# Patient Record
Sex: Female | Born: 1955 | Race: White | Marital: Single | Smoking: Never smoker
Health system: Southern US, Community
[De-identification: ages and names within clinical notes are randomized; demographics above are authoritative.]

## PROBLEM LIST (undated history)

## (undated) ENCOUNTER — Ambulatory Visit: Admission: EM | Disposition: A | Discharge status: Other Acute Inpatient Hospital | Payer: Medicare HMO

## (undated) DIAGNOSIS — M5030 Other cervical disc degeneration, unspecified cervical region: Secondary | ICD-10-CM

## (undated) DIAGNOSIS — E119 Type 2 diabetes mellitus without complications: Secondary | ICD-10-CM

## (undated) DIAGNOSIS — E669 Obesity, unspecified: Secondary | ICD-10-CM

## (undated) DIAGNOSIS — J45909 Unspecified asthma, uncomplicated: Secondary | ICD-10-CM

## (undated) DIAGNOSIS — M1990 Unspecified osteoarthritis, unspecified site: Secondary | ICD-10-CM

## (undated) DIAGNOSIS — R002 Palpitations: Secondary | ICD-10-CM

## (undated) DIAGNOSIS — G2581 Restless legs syndrome: Secondary | ICD-10-CM

## (undated) DIAGNOSIS — G629 Polyneuropathy, unspecified: Secondary | ICD-10-CM

## (undated) DIAGNOSIS — M069 Rheumatoid arthritis, unspecified: Secondary | ICD-10-CM

## (undated) DIAGNOSIS — G4730 Sleep apnea, unspecified: Secondary | ICD-10-CM

## (undated) DIAGNOSIS — Z8489 Family history of other specified conditions: Secondary | ICD-10-CM

## (undated) DIAGNOSIS — H539 Unspecified visual disturbance: Secondary | ICD-10-CM

## (undated) DIAGNOSIS — T8859XA Other complications of anesthesia, initial encounter: Secondary | ICD-10-CM

## (undated) DIAGNOSIS — D649 Anemia, unspecified: Secondary | ICD-10-CM

## (undated) DIAGNOSIS — F32A Depression, unspecified: Secondary | ICD-10-CM

## (undated) DIAGNOSIS — I2510 Atherosclerotic heart disease of native coronary artery without angina pectoris: Secondary | ICD-10-CM

## (undated) DIAGNOSIS — K219 Gastro-esophageal reflux disease without esophagitis: Secondary | ICD-10-CM

## (undated) DIAGNOSIS — M779 Enthesopathy, unspecified: Secondary | ICD-10-CM

## (undated) DIAGNOSIS — Z8669 Personal history of other diseases of the nervous system and sense organs: Secondary | ICD-10-CM

## (undated) HISTORY — PX: COLONOSCOPY: SHX174

## (undated) HISTORY — PX: CHOLECYSTECTOMY: SHX55

## (undated) HISTORY — PX: EYE SURGERY: SHX253

## (undated) HISTORY — PX: OTHER SURGICAL HISTORY: SHX169

## (undated) HISTORY — PX: HEMICOLECTOMY: SHX854

---
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
## 1994-02-06 HISTORY — PX: ABDOMINAL HYSTERECTOMY: SHX81

## 1998-03-09 HISTORY — PX: APPENDECTOMY: SHX54

## 2015-02-07 DIAGNOSIS — M23209 Derangement of unspecified meniscus due to old tear or injury, unspecified knee: Secondary | ICD-10-CM

## 2015-02-07 HISTORY — DX: Derangement of unspecified meniscus due to old tear or injury, unspecified knee: M23.209

## 2016-02-07 HISTORY — PX: OTHER SURGICAL HISTORY: SHX169

## 2017-02-06 DIAGNOSIS — H3530 Unspecified macular degeneration: Secondary | ICD-10-CM

## 2017-02-06 HISTORY — PX: CYSTOURETHROSCOPY: SHX476

## 2017-02-06 HISTORY — DX: Unspecified macular degeneration: H35.30

## 2017-06-05 HISTORY — PX: OTHER SURGICAL HISTORY: SHX169

## 2020-07-08 ENCOUNTER — Encounter: Payer: Self-pay | Admitting: *Deleted

## 2020-07-09 ENCOUNTER — Encounter
Admission: RE | Disposition: A | Discharge status: Home / Self Care | Payer: Self-pay | Source: Home / Self Care | Attending: Gastroenterology

## 2020-07-09 ENCOUNTER — Ambulatory Visit
Admission: RE | Admit: 2020-07-09 | Discharge: 2020-07-09 | Disposition: A | Discharge status: Home / Self Care | Payer: Medicare HMO | Attending: Gastroenterology | Admitting: Gastroenterology

## 2020-07-09 ENCOUNTER — Encounter: Payer: Self-pay | Admitting: *Deleted

## 2020-07-09 ENCOUNTER — Ambulatory Visit: Payer: Medicare HMO | Admitting: Certified Registered Nurse Anesthetist

## 2020-07-09 ENCOUNTER — Other Ambulatory Visit: Payer: Self-pay

## 2020-07-09 DIAGNOSIS — Z7984 Long term (current) use of oral hypoglycemic drugs: Secondary | ICD-10-CM | POA: Insufficient documentation

## 2020-07-09 DIAGNOSIS — Z880 Allergy status to penicillin: Secondary | ICD-10-CM | POA: Diagnosis not present

## 2020-07-09 DIAGNOSIS — Z7982 Long term (current) use of aspirin: Secondary | ICD-10-CM | POA: Insufficient documentation

## 2020-07-09 DIAGNOSIS — K2289 Other specified disease of esophagus: Secondary | ICD-10-CM | POA: Insufficient documentation

## 2020-07-09 DIAGNOSIS — Z794 Long term (current) use of insulin: Secondary | ICD-10-CM | POA: Insufficient documentation

## 2020-07-09 DIAGNOSIS — K2100 Gastro-esophageal reflux disease with esophagitis, without bleeding: Secondary | ICD-10-CM | POA: Diagnosis not present

## 2020-07-09 DIAGNOSIS — Z79899 Other long term (current) drug therapy: Secondary | ICD-10-CM | POA: Insufficient documentation

## 2020-07-09 DIAGNOSIS — Z881 Allergy status to other antibiotic agents status: Secondary | ICD-10-CM | POA: Insufficient documentation

## 2020-07-09 DIAGNOSIS — R1310 Dysphagia, unspecified: Secondary | ICD-10-CM | POA: Insufficient documentation

## 2020-07-09 DIAGNOSIS — Z888 Allergy status to other drugs, medicaments and biological substances status: Secondary | ICD-10-CM | POA: Diagnosis not present

## 2020-07-09 DIAGNOSIS — Z885 Allergy status to narcotic agent status: Secondary | ICD-10-CM | POA: Diagnosis not present

## 2020-07-09 DIAGNOSIS — Z5309 Procedure and treatment not carried out because of other contraindication: Secondary | ICD-10-CM | POA: Diagnosis not present

## 2020-07-09 DIAGNOSIS — M069 Rheumatoid arthritis, unspecified: Secondary | ICD-10-CM | POA: Insufficient documentation

## 2020-07-09 DIAGNOSIS — E119 Type 2 diabetes mellitus without complications: Secondary | ICD-10-CM | POA: Insufficient documentation

## 2020-07-09 HISTORY — DX: Enthesopathy, unspecified: M77.9

## 2020-07-09 HISTORY — DX: Sleep apnea, unspecified: G47.30

## 2020-07-09 HISTORY — DX: Rheumatoid arthritis, unspecified: M06.9

## 2020-07-09 HISTORY — DX: Restless legs syndrome: G25.81

## 2020-07-09 HISTORY — DX: Personal history of other diseases of the nervous system and sense organs: Z86.69

## 2020-07-09 HISTORY — DX: Other complications of anesthesia, initial encounter: T88.59XA

## 2020-07-09 HISTORY — DX: Atherosclerotic heart disease of native coronary artery without angina pectoris: I25.10

## 2020-07-09 HISTORY — DX: Type 2 diabetes mellitus without complications: E11.9

## 2020-07-09 HISTORY — DX: Other cervical disc degeneration, unspecified cervical region: M50.30

## 2020-07-09 HISTORY — DX: Unspecified osteoarthritis, unspecified site: M19.90

## 2020-07-09 HISTORY — DX: Depression, unspecified: F32.A

## 2020-07-09 HISTORY — DX: Unspecified asthma, uncomplicated: J45.909

## 2020-07-09 HISTORY — PX: ESOPHAGOGASTRODUODENOSCOPY (EGD) WITH PROPOFOL: SHX5813

## 2020-07-09 HISTORY — DX: Palpitations: R00.2

## 2020-07-09 HISTORY — DX: Gastro-esophageal reflux disease without esophagitis: K21.9

## 2020-07-09 HISTORY — DX: Obesity, unspecified: E66.9

## 2020-07-09 HISTORY — DX: Polyneuropathy, unspecified: G62.9

## 2020-07-09 HISTORY — DX: Anemia, unspecified: D64.9

## 2020-07-09 HISTORY — DX: Unspecified visual disturbance: H53.9

## 2020-07-09 HISTORY — DX: Family history of other specified conditions: Z84.89

## 2020-07-09 LAB — GLUCOSE, CAPILLARY: Glucose-Capillary: 137 mg/dL — ABNORMAL HIGH (ref 70–99)

## 2020-07-09 SURGERY — ESOPHAGOGASTRODUODENOSCOPY (EGD) WITH PROPOFOL
Anesthesia: General

## 2020-07-09 MED ORDER — PROPOFOL 500 MG/50ML IV EMUL
INTRAVENOUS | Status: DC | PRN
  Administered 2020-07-09: 200 ug/kg/min via INTRAVENOUS

## 2020-07-09 MED ORDER — LIDOCAINE HCL (CARDIAC) PF 100 MG/5ML IV SOSY
PREFILLED_SYRINGE | INTRAVENOUS | Status: DC | PRN
  Administered 2020-07-09: 60 mg via INTRAVENOUS

## 2020-07-09 MED ORDER — GLYCOPYRROLATE 0.2 MG/ML IJ SOLN
INTRAMUSCULAR | Status: DC | PRN
  Administered 2020-07-09: .1 mg via INTRAVENOUS

## 2020-07-09 MED ORDER — PROPOFOL 10 MG/ML IV BOLUS: INTRAVENOUS | Status: DC | PRN

## 2020-07-09 MED ORDER — PROPOFOL 10 MG/ML IV BOLUS
INTRAVENOUS | Status: DC | PRN
  Administered 2020-07-09: 80 mg via INTRAVENOUS

## 2020-07-09 MED ORDER — SODIUM CHLORIDE 0.9 % IV SOLN: INTRAVENOUS | Status: DC

## 2020-07-09 NOTE — H&P (Signed)
Outpatient short stay form Pre-procedure 07/09/2020 11:13 AM Kathleen Lot MD, MPH  Primary Physician: None listed  Reason for visit:  Dysphagia  History of present illness:   65 y/o lady with history of nissen fundoplication and DM II here for EGD to evaluated chronic solid food dysphagia. Only certain foods give her trouble. No blood thinners. No family history of GI malignancies. No significant new symptoms.    Current Facility-Administered Medications:  .  0.9 %  sodium chloride infusion, , Intravenous, Continuous, Manali Mcelmurry, Rossie Muskrat, MD, Last Rate: 20 mL/hr at 07/09/20 1048, New Bag at 07/09/20 1048  Medications Prior to Admission  Medication Sig Dispense Refill Last Dose  . ALPRAZolam (XANAX) 0.5 MG tablet Take 0.5 mg by mouth 2 (two) times daily as needed for anxiety.   Past Week at Unknown time  . aspirin 81 MG EC tablet Take 81 mg by mouth daily. Swallow whole.   07/07/2020 at 2000  . atorvastatin (LIPITOR) 40 MG tablet Take 40 mg by mouth daily.   07/08/2020 at 2000  . buPROPion (WELLBUTRIN) 100 MG tablet Take 100 mg by mouth daily.   07/06/2020  . DULoxetine (CYMBALTA) 60 MG capsule Take 60 mg by mouth at bedtime.   07/08/2020 at 2100  . gabapentin (NEURONTIN) 400 MG capsule Take 400 mg by mouth 3 (three) times daily.   07/08/2020 at 2100  . insulin glargine, 2 Unit Dial, (TOUJEO MAX SOLOSTAR) 300 UNIT/ML Solostar Pen Inject 18 Units into the skin at bedtime.   07/08/2020 at 2100  . insulin lispro (HUMALOG) 100 UNIT/ML KwikPen Junior Inject 6 Units into the skin.   07/08/2020 at Unknown time  . metFORMIN (GLUCOPHAGE) 500 MG tablet Take by mouth 2 (two) times daily with a meal.   07/08/2020 at 1800  . metoprolol succinate (TOPROL-XL) 25 MG 24 hr tablet Take 25 mg by mouth daily.   07/08/2020 at 2100  . nitroGLYCERIN (NITROSTAT) 0.4 MG SL tablet Place 0.4 mg under the tongue as needed for chest pain.   Past Month at Unknown time  . pantoprazole (PROTONIX) 40 MG tablet Take 40 mg by mouth daily.    07/07/2020  . rOPINIRole (REQUIP) 0.25 MG tablet Take 0.25 mg by mouth daily.   07/08/2020 at 2100  . SEMAGLUTIDE,0.25 OR 0.5MG /DOS, [place] Inject into the skin every 7 (seven) days.   07/05/2020  . DOCUSATE SODIUM PO Take by mouth daily. (Patient not taking: Reported on 07/09/2020)   Not Taking at Unknown time  . estradiol (ESTRACE) 0.1 MG/GM vaginal cream Place 1 Applicatorful vaginally daily. (Patient not taking: Reported on 07/09/2020)   Not Taking at Unknown time  . isosorbide dinitrate (ISORDIL) 30 MG tablet Take 30 mg by mouth daily. (Patient not taking: Reported on 07/09/2020)   Not Taking at Unknown time     Allergies  Allergen Reactions  . Fentanyl Anaphylaxis  . Versed [Midazolam] Anaphylaxis  . Benadryl [Diphenhydramine]   . Ceclor [Cefaclor]   . Erythromycin   . Erythromycin Base   . Fenoprofen Calcium   . Nalfon [Fenoprofen]   . Penicillins   . Phenergan [Promethazine]   . Succinylcholine      Past Medical History:  Diagnosis Date  . Anemia   . Asthma   . Bone spur    left heel  . Chronic meniscal tear of knee 2017   let knee, inoperable  . Complication of anesthesia   . Coronary artery disease   . DDD (degenerative disc disease), cervical   .  Depression   . Diabetes mellitus without complication (HCC)   . Family history of adverse reaction to anesthesia   . GERD (gastroesophageal reflux disease)   . History of cataract   . Macular degeneration 2019  . Obesity   . Osteoarthritis   . Palpitations   . Peripheral neuropathy   . Restless leg syndrome   . Rheumatoid arthritis (HCC)   . Sleep apnea   . Vision abnormalities     Review of systems:  Otherwise negative.    Physical Exam  Gen: Alert, oriented. Appears stated age.  HEENT: PERRLA. Lungs: No respiratory distress CV: RRR Abd: soft, benign, no masses Ext: No edema    Planned procedures: Proceed with EGD. The patient understands the nature of the planned procedure, indications, risks, alternatives  and potential complications including but not limited to bleeding, infection, perforation, damage to internal organs and possible oversedation/side effects from anesthesia. The patient agrees and gives consent to proceed.  Please refer to procedure notes for findings, recommendations and patient disposition/instructions.     Kathleen Lot MD, MPH Gastroenterology 07/09/2020  11:13 AM

## 2020-07-09 NOTE — Op Note (Signed)
[facility] Gastroenterology Patient Name: Kathleen Griffin Procedure Date: 07/09/2020 11:21 AM MRN: [id number] Account #: [id number] Date of Birth: [birth year] Admit Type: Outpatient Age: 65 Room: [facility] ENDO ROOM 3 Gender: Female Note Status: Finalized Procedure:             Upper GI endoscopy Indications:           Dysphagia Providers:             Andrey Farmer MD, MD Referring MD:          Marva Panda (Referring MD) Medicines:             Monitored Anesthesia Care Complications:         No immediate complications. Estimated blood loss:                         Minimal. Procedure:             Pre-Anesthesia Assessment:                        - Prior to the procedure, a History and Physical was                         performed, and patient medications and allergies were                         reviewed. The patient is competent. The risks and                         benefits of the procedure and the sedation options and                         risks were discussed with the patient. All questions                         were answered and informed consent was obtained.                         Patient identification and proposed procedure were                         verified by the physician, the nurse, the anesthetist                         and the technician in the endoscopy suite. Mental                         Status Examination: alert and oriented. Airway                         Examination: normal oropharyngeal airway and neck                         mobility. Respiratory Examination: clear to                         auscultation. CV Examination: normal. Prophylactic                         Antibiotics: The  patient does not require prophylactic                         antibiotics. Prior Anticoagulants: The patient has                         taken no previous anticoagulant or antiplatelet                         agents. ASA Grade Assessment: III - A  patient with                         severe systemic disease. After reviewing the risks and                         benefits, the patient was deemed in satisfactory                         condition to undergo the procedure. The anesthesia                         plan was to use monitored anesthesia care (MAC).                         Immediately prior to administration of medications,                         the patient was re-assessed for adequacy to receive                         sedatives. The heart rate, respiratory rate, oxygen                         saturations, blood pressure, adequacy of pulmonary                         ventilation, and response to care were monitored                         throughout the procedure. The physical status of the                         patient was re-assessed after the procedure.                        After obtaining informed consent, the endoscope was                         passed under direct vision. Throughout the procedure,                         the patient's blood pressure, pulse, and oxygen                         saturations were monitored continuously. The Endoscope                         was introduced through the mouth, with the intention  of advancing to the duodenum. The scope was advanced                         to the gastric fundus before the procedure was                         aborted. Medications were given. The procedure was                         aborted due to presence of food. Findings:      A single 9 mm nodule with a localized distribution was found at the       gastroesophageal junction. Biopsies were taken with a cold forceps for       histology. Estimated blood loss was minimal.      A medium amount of food (residue) was found in the gastric body. Impression:            - The procedure was aborted due to presence of food.                        - Nodule found in the esophagus. Biopsied.                         - A medium amount of food (residue) in the stomach. Recommendation:        - Discharge patient to home.                        - Resume previous diet.                        - Continue present medications.                        - Await pathology results.                        - Repeat upper endoscopy at appointment to be                         scheduled after full day of clears.                        - Return to referring physician as previously                         scheduled. Procedure Code(s):     --- Professional ---                        [phone], 64, Esophagogastroduodenoscopy, flexible,                         transoral; with biopsy, single or multiple Diagnosis Code(s):     --- Professional ---                        K22.8, Other specified diseases of esophagus                        R13.10, Dysphagia, unspecified CPT copyright 2019 [employer] Association. All rights  reserved. The codes documented in this report are preliminary and upon coder review may  be revised to meet current compliance requirements. Andrey Farmer MD, MD 07/09/2020 11:37:49 AM Number of Addenda: 0 Note Initiated On: 07/09/2020 11:21 AM Estimated Blood Loss:  Estimated blood loss was minimal.      [facility]

## 2020-07-09 NOTE — Interval H&P Note (Signed)
History and Physical Interval Note:  07/09/2020 11:16 AM  Kathleen Griffin  has presented today for surgery, with the diagnosis of DYSPHAGIA.  The various methods of treatment have been discussed with the patient and family. After consideration of risks, benefits and other options for treatment, the patient has consented to  Procedure(s): ESOPHAGOGASTRODUODENOSCOPY (EGD) WITH PROPOFOL (N/A) as a surgical intervention.  The patient's history has been reviewed, patient examined, no change in status, stable for surgery.  I have reviewed the patient's chart and labs.  Questions were answered to the patient's satisfaction.     Regis Bill  Ok to proceed with EGD

## 2020-07-09 NOTE — Anesthesia Procedure Notes (Addendum)
Date/Time: 07/09/2020 11:22 AM Performed by: Milagros Reap, CRNA Oxygen Delivery Method: Simple face mask

## 2020-07-09 NOTE — Transfer of Care (Signed)
Immediate Anesthesia Transfer of Care Note  Patient: Kathleen Griffin  Procedure(s) Performed: ESOPHAGOGASTRODUODENOSCOPY (EGD) WITH PROPOFOL (N/A )  Patient Location: PACU  Anesthesia Type:General  Level of Consciousness: sedated  Airway & Oxygen Therapy: Patient Spontanous Breathing and Patient connected to face mask oxygen  Post-op Assessment: Report given to RN and Post -op Vital signs reviewed and stable  Post vital signs: Reviewed and stable  Last Vitals:  Vitals Value Taken Time  BP 119/76 07/09/20 1143  Temp 36.3 C 07/09/20 1143  Pulse 72 07/09/20 1143  Resp 12 07/09/20 1143  SpO2 100 % 07/09/20 1143  Vitals shown include unvalidated device data.  Last Pain:  Vitals:   07/09/20 1143  TempSrc: Temporal  PainSc: Asleep         Complications: No complications documented.

## 2020-07-11 NOTE — Anesthesia Preprocedure Evaluation (Signed)
Anesthesia Evaluation  Patient identified by MRN, date of birth, ID band Patient awake    Reviewed: Allergy & Precautions, H&P , NPO status , Patient's Chart, lab work & pertinent test results, reviewed documented beta blocker date and time   History of Anesthesia Complications (+) Family history of anesthesia reaction and history of anesthetic complications  Airway Mallampati: II   Neck ROM: full    Dental  (+) Poor Dentition   Pulmonary asthma , sleep apnea ,    Pulmonary exam normal        Cardiovascular Exercise Tolerance: Poor + CAD  Normal cardiovascular exam Rhythm:regular Rate:Normal     Neuro/Psych PSYCHIATRIC DISORDERS Depression  Neuromuscular disease    GI/Hepatic Neg liver ROS, GERD  Medicated,  Endo/Other  negative endocrine ROSdiabetes, Poorly Controlled  Renal/GU negative Renal ROS  negative genitourinary   Musculoskeletal   Abdominal   Peds  Hematology  (+) Blood dyscrasia, anemia ,   Anesthesia Other Findings Past Medical History: No date: Anemia No date: Asthma No date: Bone spur     Comment:  left heel 2017: Chronic meniscal tear of knee     Comment:  let knee, inoperable No date: Complication of anesthesia No date: Coronary artery disease No date: DDD (degenerative disc disease), cervical No date: Depression No date: Diabetes mellitus without complication (HCC) No date: Family history of adverse reaction to anesthesia No date: GERD (gastroesophageal reflux disease) No date: History of cataract 2019: Macular degeneration No date: Obesity No date: Osteoarthritis No date: Palpitations No date: Peripheral neuropathy No date: Restless leg syndrome No date: Rheumatoid arthritis (HCC) No date: Sleep apnea No date: Vision abnormalities Past Surgical History: 1996: ABDOMINAL HYSTERECTOMY 03/1998: APPENDECTOMY No date: CHOLECYSTECTOMY No date: COLONOSCOPY 02/06/2017:  CYSTOURETHROSCOPY     Comment:  stent No date: EYE SURGERY     Comment:  eyelid 06/05/2017: nephrectomy partial robot assisted 1965: TONSILLECTOMY AND ADENOIDECTOMY 2018: vitreous retinal surgery BMI    Body Mass Index: 29.29 kg/m     Reproductive/Obstetrics negative OB ROS                             Anesthesia Physical Anesthesia Plan  ASA: III  Anesthesia Plan: General   Post-op Pain Management:    Induction:   PONV Risk Score and Plan:   Airway Management Planned:   Additional Equipment:   Intra-op Plan:   Post-operative Plan:   Informed Consent: I have reviewed the patients History and Physical, chart, labs and discussed the procedure including the risks, benefits and alternatives for the proposed anesthesia with the patient or authorized representative who has indicated his/her understanding and acceptance.     Dental Advisory Given  Plan Discussed with: CRNA  Anesthesia Plan Comments:         Anesthesia Quick Evaluation

## 2020-07-11 NOTE — Anesthesia Postprocedure Evaluation (Signed)
Anesthesia Post Note  Patient: Kathleen Griffin  Procedure(s) Performed: ESOPHAGOGASTRODUODENOSCOPY (EGD) WITH PROPOFOL (N/A )  Patient location during evaluation: PACU Anesthesia Type: General Level of consciousness: awake and alert Pain management: pain level controlled Vital Signs Assessment: post-procedure vital signs reviewed and stable Respiratory status: spontaneous breathing, nonlabored ventilation, respiratory function stable and patient connected to nasal cannula oxygen Cardiovascular status: blood pressure returned to baseline and stable Postop Assessment: no apparent nausea or vomiting Anesthetic complications: no   No complications documented.   Last Vitals:  Vitals:   07/09/20 1153 07/09/20 1203  BP: 113/69 116/77  Pulse: 78 75  Resp: 13 16  Temp:    SpO2: 99% 100%    Last Pain:  Vitals:   07/09/20 1203  TempSrc:   PainSc: 0-No pain                 Yevette Edwards

## 2020-07-12 ENCOUNTER — Encounter: Payer: Self-pay | Admitting: Gastroenterology

## 2020-07-12 LAB — SURGICAL PATHOLOGY

## 2020-09-13 ENCOUNTER — Encounter: Payer: Self-pay | Admitting: *Deleted

## 2020-09-14 ENCOUNTER — Ambulatory Visit
Admission: RE | Admit: 2020-09-14 | Discharge: 2020-09-14 | Disposition: A | Discharge status: Home / Self Care | Payer: Medicare HMO | Attending: Gastroenterology | Admitting: Gastroenterology

## 2020-09-14 ENCOUNTER — Ambulatory Visit: Payer: Medicare HMO | Admitting: Anesthesiology

## 2020-09-14 ENCOUNTER — Encounter
Admission: RE | Disposition: A | Discharge status: Home / Self Care | Payer: Self-pay | Source: Home / Self Care | Attending: Gastroenterology

## 2020-09-14 ENCOUNTER — Encounter: Payer: Self-pay | Admitting: *Deleted

## 2020-09-14 DIAGNOSIS — Z7982 Long term (current) use of aspirin: Secondary | ICD-10-CM | POA: Diagnosis not present

## 2020-09-14 DIAGNOSIS — Z87892 Personal history of anaphylaxis: Secondary | ICD-10-CM | POA: Insufficient documentation

## 2020-09-14 DIAGNOSIS — K2951 Unspecified chronic gastritis with bleeding: Secondary | ICD-10-CM | POA: Diagnosis not present

## 2020-09-14 DIAGNOSIS — Z885 Allergy status to narcotic agent status: Secondary | ICD-10-CM | POA: Diagnosis not present

## 2020-09-14 DIAGNOSIS — Z7984 Long term (current) use of oral hypoglycemic drugs: Secondary | ICD-10-CM | POA: Diagnosis not present

## 2020-09-14 DIAGNOSIS — R1310 Dysphagia, unspecified: Secondary | ICD-10-CM | POA: Diagnosis not present

## 2020-09-14 DIAGNOSIS — Z880 Allergy status to penicillin: Secondary | ICD-10-CM | POA: Diagnosis not present

## 2020-09-14 DIAGNOSIS — Z881 Allergy status to other antibiotic agents status: Secondary | ICD-10-CM | POA: Diagnosis not present

## 2020-09-14 DIAGNOSIS — Z79899 Other long term (current) drug therapy: Secondary | ICD-10-CM | POA: Diagnosis not present

## 2020-09-14 DIAGNOSIS — Z888 Allergy status to other drugs, medicaments and biological substances status: Secondary | ICD-10-CM | POA: Diagnosis not present

## 2020-09-14 DIAGNOSIS — Z9889 Other specified postprocedural states: Secondary | ICD-10-CM | POA: Insufficient documentation

## 2020-09-14 DIAGNOSIS — K2100 Gastro-esophageal reflux disease with esophagitis, without bleeding: Secondary | ICD-10-CM | POA: Diagnosis not present

## 2020-09-14 DIAGNOSIS — Z794 Long term (current) use of insulin: Secondary | ICD-10-CM | POA: Insufficient documentation

## 2020-09-14 HISTORY — PX: ESOPHAGOGASTRODUODENOSCOPY (EGD) WITH PROPOFOL: SHX5813

## 2020-09-14 LAB — GLUCOSE, CAPILLARY: Glucose-Capillary: 83 mg/dL (ref 70–99)

## 2020-09-14 SURGERY — ESOPHAGOGASTRODUODENOSCOPY (EGD) WITH PROPOFOL
Anesthesia: General

## 2020-09-14 MED ORDER — LIDOCAINE HCL (PF) 2 % IJ SOLN
INTRAMUSCULAR | Status: AC
  Filled 2020-09-14: qty 5

## 2020-09-14 MED ORDER — PROPOFOL 500 MG/50ML IV EMUL
INTRAVENOUS | Status: AC
  Filled 2020-09-14: qty 50

## 2020-09-14 MED ORDER — PROPOFOL 500 MG/50ML IV EMUL
INTRAVENOUS | Status: DC | PRN
  Administered 2020-09-14: 150 ug/kg/min via INTRAVENOUS

## 2020-09-14 MED ORDER — LABETALOL HCL 5 MG/ML IV SOLN
INTRAVENOUS | Status: AC
  Filled 2020-09-14: qty 4

## 2020-09-14 MED ORDER — SODIUM CHLORIDE 0.9 % IV SOLN: INTRAVENOUS | Status: DC

## 2020-09-14 NOTE — Interval H&P Note (Signed)
History and Physical Interval Note:  09/14/2020 8:51 AM  Kathleen Griffin  has presented today for surgery, with the diagnosis of DYSPHAGIA.  The various methods of treatment have been discussed with the patient and family. After consideration of risks, benefits and other options for treatment, the patient has consented to  Procedure(s): ESOPHAGOGASTRODUODENOSCOPY (EGD) WITH PROPOFOL (N/A) as a surgical intervention.  The patient's history has been reviewed, patient examined, no change in status, stable for surgery.  I have reviewed the patient's chart and labs.  Questions were answered to the patient's satisfaction.     Regis Bill  Ok to proceed with EGD

## 2020-09-14 NOTE — Transfer of Care (Signed)
Immediate Anesthesia Transfer of Care Note  Patient: Kathleen Griffin  Procedure(s) Performed: ESOPHAGOGASTRODUODENOSCOPY (EGD) WITH PROPOFOL  Patient Location: PACU  Anesthesia Type:General  Level of Consciousness: awake and sedated  Airway & Oxygen Therapy: Patient Spontanous Breathing and Patient connected to nasal cannula oxygen  Post-op Assessment: Report given to RN and Post -op Vital signs reviewed and stable  Post vital signs: Reviewed and stable  Last Vitals:  Vitals Value Taken Time  BP    Temp    Pulse    Resp    SpO2      Last Pain:  Vitals:   09/14/20 0814  TempSrc: Temporal  PainSc: 0-No pain         Complications: No notable events documented.

## 2020-09-14 NOTE — H&P (Signed)
Outpatient short stay form Pre-procedure 09/14/2020 8:49 AM Merlyn Lot MD, MPH  Primary Physician: None listed  Reason for visit:  Dysphagia  History of present illness:   65 y/o lady with history of nissen fundoplication here for EGD for solid food dysphagia. No blood thinners. No family history of GI malignancies.    Current Facility-Administered Medications:    0.9 %  sodium chloride infusion, , Intravenous, Continuous, Amel Kitch, Rossie Muskrat, MD, Last Rate: 20 mL/hr at 09/14/20 0837, New Bag at 09/14/20 0837  Medications Prior to Admission  Medication Sig Dispense Refill Last Dose   aspirin 81 MG EC tablet Take 81 mg by mouth daily. Swallow whole.   09/13/2020   atorvastatin (LIPITOR) 40 MG tablet Take 40 mg by mouth daily.   09/13/2020   buPROPion (WELLBUTRIN) 100 MG tablet Take 100 mg by mouth daily.   09/13/2020   DULoxetine (CYMBALTA) 60 MG capsule Take 60 mg by mouth at bedtime.   09/13/2020   gabapentin (NEURONTIN) 400 MG capsule Take 400 mg by mouth 3 (three) times daily.   09/13/2020   insulin glargine, 2 Unit Dial, (TOUJEO MAX SOLOSTAR) 300 UNIT/ML Solostar Pen Inject 18 Units into the skin at bedtime.   09/13/2020   insulin lispro (HUMALOG) 100 UNIT/ML KwikPen Junior Inject 6 Units into the skin.   09/13/2020   metFORMIN (GLUCOPHAGE) 500 MG tablet Take by mouth 2 (two) times daily with a meal.   09/13/2020   metoprolol succinate (TOPROL-XL) 25 MG 24 hr tablet Take 25 mg by mouth daily.   09/13/2020   pantoprazole (PROTONIX) 40 MG tablet Take 40 mg by mouth daily.   09/13/2020   rOPINIRole (REQUIP) 0.25 MG tablet Take 0.25 mg by mouth daily.   09/13/2020   SEMAGLUTIDE,0.25 OR 0.5MG /DOS, [place] Inject into the skin every 7 (seven) days.   09/13/2020   ALPRAZolam (XANAX) 0.5 MG tablet Take 0.5 mg by mouth 2 (two) times daily as needed for anxiety.   07/21/2020   DOCUSATE SODIUM PO Take by mouth daily. (Patient not taking: No sig reported)   Not Taking   estradiol (ESTRACE) 0.1 MG/GM vaginal cream Place  1 Applicatorful vaginally daily. (Patient not taking: No sig reported)   Not Taking   isosorbide dinitrate (ISORDIL) 30 MG tablet Take 30 mg by mouth daily. (Patient not taking: No sig reported)   Not Taking   nitroGLYCERIN (NITROSTAT) 0.4 MG SL tablet Place 0.4 mg under the tongue as needed for chest pain.   05/19/2020     Allergies  Allergen Reactions   Fentanyl Anaphylaxis   Versed [Midazolam] Anaphylaxis   Benadryl [Diphenhydramine]    Ceclor [Cefaclor]    Erythromycin    Erythromycin Base    Fenoprofen Calcium    Nalfon [Fenoprofen]    Penicillins    Phenergan [Promethazine]    Succinylcholine      Past Medical History:  Diagnosis Date   Anemia    Asthma    Bone spur    left heel   Chronic meniscal tear of knee 2017   let knee, inoperable   Complication of anesthesia    Coronary artery disease    DDD (degenerative disc disease), cervical    Depression    Diabetes mellitus without complication (HCC)    Family history of adverse reaction to anesthesia    GERD (gastroesophageal reflux disease)    History of cataract    Macular degeneration 2019   Obesity    Osteoarthritis    Palpitations  Peripheral neuropathy    Restless leg syndrome    Rheumatoid arthritis (HCC)    Sleep apnea    Vision abnormalities     Review of systems:  Otherwise negative.    Physical Exam  Gen: Alert, oriented. Appears stated age.  HEENT: PERRLA. Lungs: No respiratory distress CV: RRR Abd: soft, benign, no masses Ext: No edema.    Planned procedures: Proceed with EGD. The patient understands the nature of the planned procedure, indications, risks, alternatives and potential complications including but not limited to bleeding, infection, perforation, damage to internal organs and possible oversedation/side effects from anesthesia. The patient agrees and gives consent to proceed.  Please refer to procedure notes for findings, recommendations and patient disposition/instructions.      Merlyn Lot MD, MPH Gastroenterology 09/14/2020  8:49 AM

## 2020-09-14 NOTE — Anesthesia Postprocedure Evaluation (Signed)
Anesthesia Post Note  Patient: Kathleen Griffin  Procedure(s) Performed: ESOPHAGOGASTRODUODENOSCOPY (EGD) WITH PROPOFOL  Patient location during evaluation: PACU Anesthesia Type: General Level of consciousness: awake and alert Pain management: pain level controlled Vital Signs Assessment: post-procedure vital signs reviewed and stable Respiratory status: spontaneous breathing, nonlabored ventilation, respiratory function stable and patient connected to nasal cannula oxygen Cardiovascular status: blood pressure returned to baseline and stable Postop Assessment: no apparent nausea or vomiting Anesthetic complications: no   No notable events documented.   Last Vitals:  Vitals:   09/14/20 0814 09/14/20 0914  BP: 121/71   Pulse: 72   Resp: 17   Temp: (!) 35.8 C (!) 35.8 C  SpO2: 99%     Last Pain:  Vitals:   09/14/20 0924  TempSrc:   PainSc: 0-No pain                 Irvan Tiedt Michae Kava

## 2020-09-14 NOTE — Anesthesia Preprocedure Evaluation (Signed)
Anesthesia Evaluation  Patient identified by MRN, date of birth, ID band Patient awake    Reviewed: Allergy & Precautions, NPO status   History of Anesthesia Complications (+) history of anesthetic complications  Airway Mallampati: II  TM Distance: >3 FB Neck ROM: Full    Dental no notable dental hx.    Pulmonary asthma , sleep apnea ,    Pulmonary exam normal        Cardiovascular Normal cardiovascular examI     Neuro/Psych CVA, Residual Symptoms    GI/Hepatic GERD  Controlled,  Endo/Other  diabetes  Renal/GU      Musculoskeletal   Abdominal Normal abdominal exam  (+)   Peds  Hematology   Anesthesia Other Findings H/o coding in a previous anesthesia. Mentions that it was probably with versed and fentanyl. Ok with propofol based anesthesa  Reproductive/Obstetrics                             Anesthesia Physical Anesthesia Plan  ASA: 2  Anesthesia Plan: General   Post-op Pain Management:    Induction:   PONV Risk Score and Plan:   Airway Management Planned: Simple Face Mask  Additional Equipment:   Intra-op Plan:   Post-operative Plan:   Informed Consent: I have reviewed the patients History and Physical, chart, labs and discussed the procedure including the risks, benefits and alternatives for the proposed anesthesia with the patient or authorized representative who has indicated his/her understanding and acceptance.       Plan Discussed with: CRNA  Anesthesia Plan Comments:         Anesthesia Quick Evaluation

## 2020-09-14 NOTE — Op Note (Signed)
[facility] Patient Name: Kathleen Griffin Procedure Date: 09/14/2020 8:45 AM MRN: [id number] Account #: [id number] Date of Birth: November 17, 1955 Admit Type: Outpatient Age: 65 Room: [facility] ENDO ROOM 1 Gender: Female Note Status: Finalized Procedure:             Upper GI endoscopy Indications:           Dysphagia Providers:             Andrey Farmer MD, MD Referring MD:          No Local Md, MD (Referring MD) Medicines:             Monitored Anesthesia Care Complications:         No immediate complications. Estimated blood loss:                         Minimal. Procedure:             Pre-Anesthesia Assessment:                        - Prior to the procedure, a History and Physical was                         performed, and patient medications and allergies were                         reviewed. The patient is competent. The risks and                         benefits of the procedure and the sedation options and                         risks were discussed with the patient. All questions                         were answered and informed consent was obtained.                         Patient identification and proposed procedure were                         verified by the physician, the nurse, the anesthetist                         and the technician in the endoscopy suite. Mental                         Status Examination: alert and oriented. Airway                         Examination: normal oropharyngeal airway and neck                         mobility. Respiratory Examination: clear to                         auscultation. CV Examination: normal. Prophylactic                         Antibiotics:  The patient does not require prophylactic                         antibiotics. Prior Anticoagulants: The patient has                         taken no previous anticoagulant or antiplatelet                         agents. ASA Grade Assessment: III - A patient  with                         severe systemic disease. After reviewing the risks and                         benefits, the patient was deemed in satisfactory                         condition to undergo the procedure. The anesthesia                         plan was to use monitored anesthesia care (MAC).                         Immediately prior to administration of medications,                         the patient was re-assessed for adequacy to receive                         sedatives. The heart rate, respiratory rate, oxygen                         saturations, blood pressure, adequacy of pulmonary                         ventilation, and response to care were monitored                         throughout the procedure. The physical status of the                         patient was re-assessed after the procedure.                        After obtaining informed consent, the endoscope was                         passed under direct vision. Throughout the procedure,                         the patient's blood pressure, pulse, and oxygen                         saturations were monitored continuously. The Endoscope                         was introduced through the mouth, and advanced to the  second part of duodenum. The upper GI endoscopy was                         accomplished without difficulty. The patient tolerated                         the procedure well. Findings:      A prior Nissen fundoplication was found in the lower third of the       esophagus.      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      No endoscopic abnormality was evident in the esophagus to explain the       patient's complaint of dysphagia besides possible tight wrap. It was       decided, however, to proceed with dilation of the lower third of the       esophagus. A TTS dilator was passed through the scope. Dilation with an       18-19-20 mm balloon dilator was performed to 18  mm. The dilation site       was examined and showed no change. Biopsies were taken with a cold       forceps for histology. Estimated blood loss was minimal.      Hematin (altered blood/coffee-ground-like material) was found in the       entire examined stomach. Biopsies were taken with a cold forceps for       Helicobacter pylori testing. Estimated blood loss was minimal.      The examined duodenum was normal. Impression:            - A Nissen fundoplication was found.                        - Hypertonic lower esophageal sphincter.                        - No endoscopic esophageal abnormality to explain                         patient's dysphagia. Esophagus dilated. Dilated.                         Biopsied.                        - Hematin (altered blood/coffee-ground-like material)                         in the entire stomach. Biopsied.                        - Normal examined duodenum. Recommendation:        - Discharge patient to home.                        - Resume previous diet.                        - Continue present medications.                        - Await pathology results.                        -  Return to referring physician as previously                         scheduled. Procedure Code(s):     --- Professional ---                        [phone], Esophagogastroduodenoscopy, flexible,                         transoral; with transendoscopic balloon dilation of                         esophagus (less than 30 mm diameter) Diagnosis Code(s):     --- Professional ---                        I09.735, Other specified postprocedural states                        R13.10, Dysphagia, unspecified                        K92.2, Gastrointestinal hemorrhage, unspecified CPT copyright 2019 [employer]. All rights reserved. The codes documented in this report are preliminary and upon coder review may  be revised to meet current compliance requirements. Andrey Farmer  MD, MD 09/14/2020 9:16:52 AM Number of Addenda: 0 Note Initiated On: 09/14/2020 8:45 AM Estimated Blood Loss:  Estimated blood loss was minimal.      [facility]

## 2020-09-14 NOTE — Anesthesia Procedure Notes (Signed)
Date/Time: 09/14/2020 9:00 AM Performed by: Tonia Ghent Pre-anesthesia Checklist: Patient identified, Emergency Drugs available, Suction available, Patient being monitored and Timeout performed Patient Re-evaluated:Patient Re-evaluated prior to induction Oxygen Delivery Method: Nasal cannula Preoxygenation: Pre-oxygenation with 100% oxygen Induction Type: IV induction Airway Equipment and Method: Bite block Placement Confirmation: CO2 detector and positive ETCO2

## 2020-09-15 ENCOUNTER — Encounter: Payer: Self-pay | Admitting: Gastroenterology

## 2020-09-16 LAB — SURGICAL PATHOLOGY

## 2020-10-15 ENCOUNTER — Emergency Department
Admission: EM | Admit: 2020-10-15 | Discharge: 2020-10-15 | Disposition: A | Discharge status: Home / Self Care | Payer: Medicare HMO | Attending: Emergency Medicine | Admitting: Emergency Medicine

## 2020-10-15 ENCOUNTER — Other Ambulatory Visit: Payer: Self-pay

## 2020-10-15 ENCOUNTER — Emergency Department: Payer: Medicare HMO

## 2020-10-15 DIAGNOSIS — R531 Weakness: Secondary | ICD-10-CM | POA: Insufficient documentation

## 2020-10-15 DIAGNOSIS — J45909 Unspecified asthma, uncomplicated: Secondary | ICD-10-CM | POA: Insufficient documentation

## 2020-10-15 DIAGNOSIS — R791 Abnormal coagulation profile: Secondary | ICD-10-CM | POA: Diagnosis not present

## 2020-10-15 DIAGNOSIS — R918 Other nonspecific abnormal finding of lung field: Secondary | ICD-10-CM

## 2020-10-15 DIAGNOSIS — Z20822 Contact with and (suspected) exposure to covid-19: Secondary | ICD-10-CM | POA: Diagnosis not present

## 2020-10-15 DIAGNOSIS — Z7984 Long term (current) use of oral hypoglycemic drugs: Secondary | ICD-10-CM | POA: Diagnosis not present

## 2020-10-15 DIAGNOSIS — R911 Solitary pulmonary nodule: Secondary | ICD-10-CM | POA: Insufficient documentation

## 2020-10-15 DIAGNOSIS — I2510 Atherosclerotic heart disease of native coronary artery without angina pectoris: Secondary | ICD-10-CM | POA: Insufficient documentation

## 2020-10-15 DIAGNOSIS — R739 Hyperglycemia, unspecified: Secondary | ICD-10-CM

## 2020-10-15 DIAGNOSIS — R079 Chest pain, unspecified: Secondary | ICD-10-CM

## 2020-10-15 DIAGNOSIS — Z7982 Long term (current) use of aspirin: Secondary | ICD-10-CM | POA: Insufficient documentation

## 2020-10-15 DIAGNOSIS — N3000 Acute cystitis without hematuria: Secondary | ICD-10-CM | POA: Diagnosis not present

## 2020-10-15 DIAGNOSIS — Z794 Long term (current) use of insulin: Secondary | ICD-10-CM | POA: Insufficient documentation

## 2020-10-15 DIAGNOSIS — I700 Atherosclerosis of aorta: Secondary | ICD-10-CM

## 2020-10-15 DIAGNOSIS — R7989 Other specified abnormal findings of blood chemistry: Secondary | ICD-10-CM

## 2020-10-15 DIAGNOSIS — E1165 Type 2 diabetes mellitus with hyperglycemia: Secondary | ICD-10-CM | POA: Diagnosis not present

## 2020-10-15 LAB — BASIC METABOLIC PANEL
Anion gap: 10 (ref 5–15)
BUN: 25 mg/dL — ABNORMAL HIGH (ref 8–23)
CO2: 23 mmol/L (ref 22–32)
Calcium: 9.2 mg/dL (ref 8.9–10.3)
Chloride: 99 mmol/L (ref 98–111)
Creatinine, Ser: 1.22 mg/dL — ABNORMAL HIGH (ref 0.44–1.00)
GFR, Estimated: 50 mL/min — ABNORMAL LOW (ref >60)
Glucose, Bld: 268 mg/dL — ABNORMAL HIGH (ref 70–99)
Potassium: 4.1 mmol/L (ref 3.5–5.1)
Sodium: 132 mmol/L — ABNORMAL LOW (ref 135–145)

## 2020-10-15 LAB — RESP PANEL BY RT-PCR (FLU A&B, COVID) ARPGX2
Influenza A by PCR: NEGATIVE
Influenza B by PCR: NEGATIVE
SARS Coronavirus 2 by RT PCR: NEGATIVE

## 2020-10-15 LAB — URINALYSIS, COMPLETE (UACMP) WITH MICROSCOPIC
Glucose, UA: 100 mg/dL — AB
Ketones, ur: 15 mg/dL — AB
Nitrite: NEGATIVE
Protein, ur: 100 mg/dL — AB
Specific Gravity, Urine: 1.02 (ref 1.005–1.030)
WBC, UA: >50 WBC/hpf — ABNORMAL HIGH (ref 0–5)
pH: 5 (ref 5.0–8.0)

## 2020-10-15 LAB — CBC
HCT: 35.3 % — ABNORMAL LOW (ref 36.0–46.0)
Hemoglobin: 12 g/dL (ref 12.0–15.0)
MCH: 29.1 pg (ref 26.0–34.0)
MCHC: 34 g/dL (ref 30.0–36.0)
MCV: 85.5 fL (ref 80.0–100.0)
Platelets: 177 10*3/uL (ref 150–400)
RBC: 4.13 MIL/uL (ref 3.87–5.11)
RDW: 13.5 % (ref 11.5–15.5)
WBC: 8.6 10*3/uL (ref 4.0–10.5)
nRBC: 0 % (ref 0.0–0.2)

## 2020-10-15 LAB — TROPONIN I (HIGH SENSITIVITY)
Troponin I (High Sensitivity): 11 ng/L (ref <18)
Troponin I (High Sensitivity): 11 ng/L (ref <18)

## 2020-10-15 LAB — HEMOGLOBIN A1C
Hgb A1c MFr Bld: 10.3 % — ABNORMAL HIGH (ref 4.8–5.6)
Mean Plasma Glucose: 248.91 mg/dL

## 2020-10-15 LAB — PROCALCITONIN: Procalcitonin: 2.14 ng/mL

## 2020-10-15 LAB — D-DIMER, QUANTITATIVE: D-Dimer, Quant: 2.69 ug/mL-FEU — ABNORMAL HIGH (ref 0.00–0.50)

## 2020-10-15 MED ORDER — IOHEXOL 350 MG/ML SOLN
75.0000 mL | Freq: Once | INTRAVENOUS | Status: AC | PRN
  Administered 2020-10-15: 75 mL via INTRAVENOUS
  Filled 2020-10-15: qty 75

## 2020-10-15 MED ORDER — SODIUM CHLORIDE 0.9 % IV SOLN
1.0000 g | Freq: Once | INTRAVENOUS | Status: AC
  Administered 2020-10-15: 1 g via INTRAVENOUS
  Filled 2020-10-15: qty 10

## 2020-10-15 MED ORDER — NITROFURANTOIN MONOHYD MACRO 100 MG PO CAPS: 100.0000 mg | ORAL_CAPSULE | Freq: Two times a day (BID) | ORAL | 0 refills | Status: AC

## 2020-10-15 MED ORDER — LACTATED RINGERS IV BOLUS
500.0000 mL | Freq: Once | INTRAVENOUS | Status: AC
  Administered 2020-10-15: 500 mL via INTRAVENOUS

## 2020-10-15 MED ORDER — INSULIN ASPART 100 UNIT/ML IJ SOLN
0.0000 [IU] | INTRAMUSCULAR | Status: DC
  Administered 2020-10-15: 8 [IU] via SUBCUTANEOUS
  Filled 2020-10-15: qty 1

## 2020-10-15 NOTE — ED Triage Notes (Signed)
Pt come with c/o fever for few nights, some right sided CP and just feeling weak. Pt states 3 days ago.  Pt states some SOB. Pt states some nausea.

## 2020-10-15 NOTE — Discharge Instructions (Addendum)
Your CTA Chest today showed: No evidence of pulmonary embolism.  Multiple BILATERAL well-defined pulmonary nodules up to 9 mm diameter, could represent metastatic lesions or potentially rheumatoid nodules. In the absence of known prior nodules, consider tissue diagnosis; the majority of the lesions are too small for PET-CT assessment. Coronary artery calcifications. Aortic Atherosclerosis (ICD10-I70.0).

## 2020-10-15 NOTE — ED Provider Notes (Addendum)
[facility] Emergency Department Provider Note  ____________________________________________   Event Date/Time   First MD Initiated Contact with Patient 10/15/20 1655     (approximate)  I have reviewed the triage vital signs and the nursing notes.   HISTORY  Chief Complaint Weakness and Chest Pain   HPI Kathleen Griffin is a 65 y.o. female with a past medical history of anemia, asthma, CAD, depression, GERD, peripheral neuropathy, RLS and OSA who presents for assessment approximately 3 days of some right-sided chest discomfort, shortness of breath, subjective fevers, severe fatigue and malodorous urine.  Denies any headache, earache sore throat, cough, abdominal pain, nausea, vomiting, diarrhea, burning with urination, rash or extremity pain.  No recent falls or injuries.  He does note she has had decreased appetite her last couple days.  No other acute concerns at this time.         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   Past Surgical History:  Procedure Laterality Date   ABDOMINAL HYSTERECTOMY  1996   APPENDECTOMY  03/1998   CHOLECYSTECTOMY     COLONOSCOPY     CYSTOURETHROSCOPY  02/06/2017   stent   esophagogastric nissen fundoplasty single port     ESOPHAGOGASTRODUODENOSCOPY (EGD) WITH PROPOFOL N/A  07/09/2020   Procedure: ESOPHAGOGASTRODUODENOSCOPY (EGD) WITH PROPOFOL;  Surgeon: Regis Bill, MD;  Location: [facility] ENDOSCOPY;  Service: Endoscopy;  Laterality: N/A;   ESOPHAGOGASTRODUODENOSCOPY (EGD) WITH PROPOFOL N/A 09/14/2020   Procedure: ESOPHAGOGASTRODUODENOSCOPY (EGD) WITH PROPOFOL;  Surgeon: Regis Bill, MD;  Location: [facility] ENDOSCOPY;  Service: Endoscopy;  Laterality: N/A;   EYE SURGERY     eyelid   HEMICOLECTOMY     nephrectomy partial robot assisted  06/05/2017   TONSILLECTOMY AND ADENOIDECTOMY  1965   vitreous retinal surgery  2018    Prior to Admission medications   Medication Sig Start Date End Date Taking? Authorizing Provider  ALPRAZolam Prudy Feeler) 0.5 MG tablet Take 0.5 mg by mouth 2 (two) times daily as needed for anxiety.    [provider]  aspirin 81 MG EC tablet Take 81 mg by mouth daily. Swallow whole.    [provider]  atorvastatin (LIPITOR) 40 MG tablet Take 40 mg by mouth daily.    [provider]  buPROPion (WELLBUTRIN) 100 MG tablet Take 100 mg by mouth daily.    [provider]  DOCUSATE SODIUM PO Take by mouth daily. Patient not taking: No sig reported    [provider]  DULoxetine (CYMBALTA) 60 MG capsule Take 60 mg by mouth at bedtime.    [provider]  estradiol (ESTRACE) 0.1 MG/GM vaginal cream Place 1 Applicatorful vaginally daily. Patient  not taking: No sig reported    [provider]  gabapentin (NEURONTIN) 400 MG capsule Take 400 mg by mouth 3 (three) times daily.    [provider]  insulin glargine, 2 Unit Dial, (TOUJEO MAX SOLOSTAR) 300 UNIT/ML Solostar Pen Inject 18 Units into the skin at bedtime.    [provider]  insulin lispro (HUMALOG) 100 UNIT/ML KwikPen Junior Inject 6 Units into the skin.    [provider]  isosorbide dinitrate (ISORDIL) 30 MG tablet Take 30 mg by mouth daily. Patient not taking: No sig reported    [provider]  metFORMIN (GLUCOPHAGE) 500 MG tablet Take by mouth 2 (two) times daily with a meal.    [provider]  metoprolol succinate (TOPROL-XL) 25 MG 24 hr tablet Take 25 mg by mouth daily.    [provider]  nitrofurantoin, macrocrystal-monohydrate, (MACROBID) 100 MG capsule Take 1 capsule (100 mg total) by mouth 2 (two) times daily for 7 days. 10/15/20 10/22/20 Yes Gilles Chiquito, MD  nitroGLYCERIN (NITROSTAT) 0.4 MG SL tablet Place 0.4 mg under the tongue as needed for chest pain.    [provider]  pantoprazole (PROTONIX) 40 MG tablet Take 40 mg by mouth daily.    [provider]  rOPINIRole (REQUIP) 0.25 MG tablet Take 0.25 mg by mouth daily.    [provider]  SEMAGLUTIDE,0.25 OR 0.5MG /DOS, [place] Inject into the skin every 7 (seven) days.    [provider]    Allergies Fentanyl, Versed [midazolam], Benadryl [diphenhydramine], Ceclor [cefaclor], Erythromycin, Erythromycin base, Fenoprofen calcium, Nalfon [fenoprofen], Penicillins, Phenergan [promethazine], and Succinylcholine  Family History  Problem Relation Age of Onset   Parkinson's disease Mother    Atrial fibrillation Mother    Hypertension Mother    Neuropathy Mother    Rheum arthritis Mother    Myelodysplastic syndrome Father    Glaucoma Father    Atrial fibrillation Father    Melanoma Father    Glaucoma Sister     Social History Social History   Tobacco Use   Smoking status: Never   Smokeless tobacco: Never  Vaping Use   Vaping Use: Never used  Substance Use Topics   Alcohol use: Never   Drug use: Never    Review of Systems  Review of Systems  Constitutional:  Positive for chills and fever.  HENT:  Negative for sore throat.   Eyes:  Negative for pain.  Respiratory:  Positive for shortness of breath. Negative for cough and stridor.   Cardiovascular:  Positive for chest pain.  Gastrointestinal:  Negative for vomiting.  Genitourinary:  Negative for dysuria.   Musculoskeletal:  Negative for myalgias.  Skin:  Negative for rash.  Neurological:  Negative for seizures, loss of consciousness and headaches.  Psychiatric/Behavioral:  Negative for suicidal ideas.   All other systems reviewed and are negative.    ____________________________________________   PHYSICAL EXAM:  VITAL SIGNS: ED Triage Vitals  Enc Vitals Group     BP 10/15/20 1544 111/78     Pulse Rate 10/15/20 1544 (!) 103     Resp 10/15/20 1544 18     Temp 10/15/20 1544 99.3 F (37.4 C)     Temp Source 10/15/20 1544 Oral     SpO2 10/15/20 1544 96 %     Weight --      Height --      Head Circumference --      Peak Flow --      Pain  Score 10/15/20 1547 3     Pain Loc --      Pain Edu? --      Excl. in GC? --    Vitals:   10/15/20 1903 10/15/20 1930  BP: (!) 138/100 (!) 146/86  Pulse: 90 87  Resp: 17 19  Temp:    SpO2: 99% 100%   Physical Exam Vitals and nursing note reviewed.  Constitutional:      General: She is not in acute distress.    Appearance: She is well-developed.  HENT:     Head: Normocephalic and atraumatic.     Right Ear: External ear normal.     Left Ear: External ear normal.     Nose: Nose normal.  Eyes:     Conjunctiva/sclera: Conjunctivae normal.  Cardiovascular:     Rate and Rhythm: Regular rhythm. Tachycardia present.     Heart sounds: No murmur heard. Pulmonary:     Effort: Pulmonary effort is normal. No respiratory distress.     Breath sounds: Normal breath sounds.  Abdominal:     Palpations: Abdomen is soft.     Tenderness: There is no abdominal tenderness. There is no right CVA tenderness or left CVA tenderness.  Musculoskeletal:     Cervical back: Neck supple.     Right lower leg: No edema.     Left lower leg: No edema.  Skin:    General: Skin is warm and dry.     Capillary Refill: Capillary refill takes 2 to 3 seconds.  Neurological:     Mental Status: She is alert and oriented to person, place, and time.  Psychiatric:         Mood and Affect: Mood normal.     ____________________________________________   LABS (all labs ordered are listed, but only abnormal results are displayed)  Labs Reviewed  BASIC METABOLIC PANEL - Abnormal; Notable for the following components:      Result Value   Sodium 132 (*)    Glucose, Bld 268 (*)    BUN 25 (*)    Creatinine, Ser 1.22 (*)    GFR, Estimated 50 (*)    All other components within normal limits  CBC - Abnormal; Notable for the following components:   HCT 35.3 (*)    All other components within normal limits  URINALYSIS, COMPLETE (UACMP) WITH MICROSCOPIC - Abnormal; Notable for the following components:   APPearance CLOUDY (*)    Glucose, UA 100 (*)    Hgb urine dipstick MODERATE (*)    Bilirubin Urine SMALL (*)    Ketones, ur 15 (*)    Protein, ur 100 (*)    Leukocytes,Ua MODERATE (*)    WBC, UA >50 (*)    Bacteria, UA MANY (*)    All other components within normal limits  D-DIMER, QUANTITATIVE - Abnormal; Notable for the following components:   D-Dimer, Quant 2.69 (*)    All other components within normal limits  RESP PANEL BY RT-PCR (FLU A&B, COVID) ARPGX2  URINE CULTURE  PROCALCITONIN  HEMOGLOBIN A1C  TROPONIN I (HIGH SENSITIVITY)  TROPONIN I (HIGH SENSITIVITY)   ____________________________________________  EKG  ECG remarkable for sinus tachycardia with a ventricle rate of 103, normal axis, unremarkable intervals with nonspecific ST change in lead I without other clearance of acute ischemia or significant arrhythmia. ____________________________________________  RADIOLOGY  ED MD interpretation: Chest x-ray has no focal consolidation, effusion, edema, pneumothorax.  Right-sided central venous port tip projecting over the left mediastinum.  CTA chest shows  no evidence of PE, focal consolidation or other clear acute process.  There is evidence of gastric thickening as a possible gastritis.  There is also bilateral well-defined pulmonary nodules,  aortic atherosclerosis and some CAD.  No other clear acute process.  Official radiology report(s): DG Chest 2 View  Result Date: 10/15/2020 CLINICAL DATA:  Chest pain EXAM: CHEST - 2 VIEW COMPARISON:  None. FINDINGS: Right-sided central venous port tip with catheter directed to the patient's left over the expected location of no focal opacity or pleural effusion. Normal cardiomediastinal silhouette. No pneumothorax. Electronic device over the left chest. Brachiocephalic region. IMPRESSION: 1. No acute airspace disease 2. Right-sided central venous port tip directed to the patient's left over the mediastinum, probably within the left brachiocephalic vein Electronically Signed   By: Jasmine Pang M.D.   On: 10/15/2020 16:18   CT Angio Chest PE W and/or Wo Contrast  Result Date: 10/15/2020 CLINICAL DATA:  Fever, RIGHT-side chest pain, weakness, symptoms for 3 days. History diabetes mellitus, asthma, rheumatoid arthritis, GERD EXAM: CT ANGIOGRAPHY CHEST WITH CONTRAST TECHNIQUE: Multidetector CT imaging of the chest was performed using the standard protocol during bolus administration of intravenous contrast. Multiplanar CT image reconstructions and MIPs were obtained to evaluate the vascular anatomy. CONTRAST:  57mL OMNIPAQUE IOHEXOL 350 MG/ML SOLN IV COMPARISON:  None FINDINGS: Cardiovascular: Mild enlargement of cardiac chambers. Aorta normal caliber without aneurysm or dissection. No pericardial effusion. Atherosclerotic calcifications within coronary arteries. Pulmonary arteries adequately opacified and patent. No evidence of pulmonary embolism. Mediastinum/Nodes: Questionable wall thickening of distal esophagus. Remainder of esophagus unremarkable. Base of cervical region normal appearance. No thoracic adenopathy. RIGHT jugular Port-A-Cath. No adenopathy. Lungs/Pleura: Multiple BILATERAL well-defined from pulmonary nodules up to 9 mm diameter. The largest of these at the RIGHT lung base contains questionable  calcification versus enhancement. These could represent metastatic lesions or potentially rheumatoid nodules. No pulmonary infiltrate, pleural effusion, or pneumothorax. Upper Abdomen: Gallbladder surgically absent. Rounded calcification at gallbladder fossa question spilled gallstone versus calcified granuloma. Diffuse gastric wall thickening. Bubbly pattern throughout the gastric lumen, may represent ingested food material though bezoars can have a similar appearance. Remaining visualized upper abdomen unremarkable. Musculoskeletal: No acute osseous findings. Review of the MIP images confirms the above findings. IMPRESSION: No evidence of pulmonary embolism. Multiple BILATERAL well-defined pulmonary nodules up to 9 mm diameter, could represent metastatic lesions or potentially rheumatoid nodules. Does patient have a history of malignancy? Comparison with any prior outside CT chest exam patient may have would be of benefit in establishing stability. In the absence of known prior nodules, consider tissue diagnosis; the majority of the lesions are too small for PET-CT assessment. Diffuse gastric wall thickening question gastritis. Bubbly pattern throughout the gastric lumen likely represents ingested food material though bezoars can have a similar appearance. Coronary artery calcifications. Aortic Atherosclerosis (ICD10-I70.0). Electronically Signed   By: Ulyses Southward M.D.   On: 10/15/2020 19:35    ____________________________________________   PROCEDURES  Procedure(s) performed (including Critical Care):  Procedures   ____________________________________________   INITIAL IMPRESSION / ASSESSMENT AND PLAN / ED COURSE      Patient presents with above-stated history exam for assessment approximately 3 days of fatigue, right-sided chest pain, shortness of breath, subjective fevers and reporting some malodorous urine.  On arrival she is slightly tachycardic at 103 with otherwise stable vital signs on  room air.  Lungs are clear bilaterally.  Abdomen is soft.  No significant edema.    With regard to patient's shortness of breath  chest discomfort as well as subjective fevers differential includes pneumonia, bronchitis, PE, pleurisy, ACS, metabolic derangements costochondritis.  Patient denies any burning with urination but was quite concerned about malodorous urine.  No abdominal pain or back pain or other findings history of pyelonephritis will obtain a UA to rule out urinary tract infection.  Urinalysis is consistent with an acute cystitis with moderate leukocyte esterase, greater than 50 WBCs and bacteria noted.  There is also some protein, small bilirubin, glucose.  I will send a urine culture and give patient dose of Rocephin while remainder of work-up.  I do not believe she is septic and she has no leukocytosis, fever, hypotension or evidence of a sending infection i.e. pyelonephritis.  ECG has nonspecific findings but no significant arrhythmia.  However given troponin of 11 obtained greater than 3 hours after symptom onset have a low suspicion for ACS or myocarditis.  COVID influenza PCR is negative.  D-dimer is elevated 2.69.  Chest x-ray has old port from previous iron infusions in place but no evidence of focal consolidation, heart failure, effusion, pneumothorax or other acute thoracic process.  CTA obtained due to elevated D-dimer chest shows no evidence of PE, focal consolidation or other clear acute process.  There is evidence of gastric thickening as a possible gastritis.  There is also bilateral well-defined pulmonary nodules, aortic atherosclerosis and some CAD.  No other clear acute process.  BMP remarkable for some hyperglycemia with a glucose of 268 without any other significant electrolyte or metabolic derangements.  CBC without leukocytosis or acute anemia.  Unclear etiology of patient's right-sided chest discomfort or shortness of breath although some pleurisy versus  bronchitis is still within differential.  Given reassuring vital signs with eyes reassuring CTA chest without evidence of acute thoracic derangements noted on CTA and reassuring troponin and ECG work-up I will suspicion for immediate life-threatening process i.e. dissection.  I think she is stable for discharge with close outpatient PCP follow-up.  Discussed findings of lung nodules on CT and patient states she will follow-up with her pulmonologist at [facility] although these have been seen on prior CTs and previously attributed to fiberglass exposure.  Patient discharged stable condition.  Strict return precautions advised and discussed.   Rx written for Macrobid to cover uncomplicated cystitis.  ____________________________________________   FINAL CLINICAL IMPRESSION(S) / ED DIAGNOSES  Final diagnoses:  Acute cystitis without hematuria  Positive D dimer  Chest pain, unspecified type  Hyperglycemia  Lung nodules  Aortic atherosclerosis (HCC)  Coronary artery disease involving native heart without angina pectoris, unspecified vessel or lesion type    Medications  insulin aspart (novoLOG) injection 0-15 Units (8 Units Subcutaneous Given 10/15/20 1733)  cefTRIAXone (ROCEPHIN) 1 g in sodium chloride 0.9 % 100 mL IVPB (1 g Intravenous New Bag/Given 10/15/20 1940)  lactated ringers bolus 500 mL (0 mLs Intravenous Stopped 10/15/20 1940)  iohexol (OMNIPAQUE) 350 MG/ML injection 75 mL (75 mLs Intravenous Contrast Given 10/15/20 1836)     ED Discharge Orders          Ordered    nitrofurantoin, macrocrystal-monohydrate, (MACROBID) 100 MG capsule  2 times daily        10/15/20 1932             Note:  This document was prepared using Dragon voice recognition software and may include unintentional dictation errors.    Gilles Chiquito, MD 10/15/20 1714    Gilles Chiquito, MD 10/15/20 [id number]

## 2020-10-15 NOTE — ED Notes (Signed)
Xray took pt before able to do blood and EKG. Will perform once pt returns.

## 2020-10-18 LAB — URINE CULTURE: Culture: >=100000 — AB

## 2022-10-05 IMAGING — CR DG CHEST 2V
1 series · 2 of 2 positions shown · non-contrast
Comparison: None.

CLINICAL DATA: Chest pain

EXAM:
CHEST - 2 VIEW

[Series 1: dg chest 2 view · 0.14mm/px · 2 of 2 slices shown]
[im 1/2]
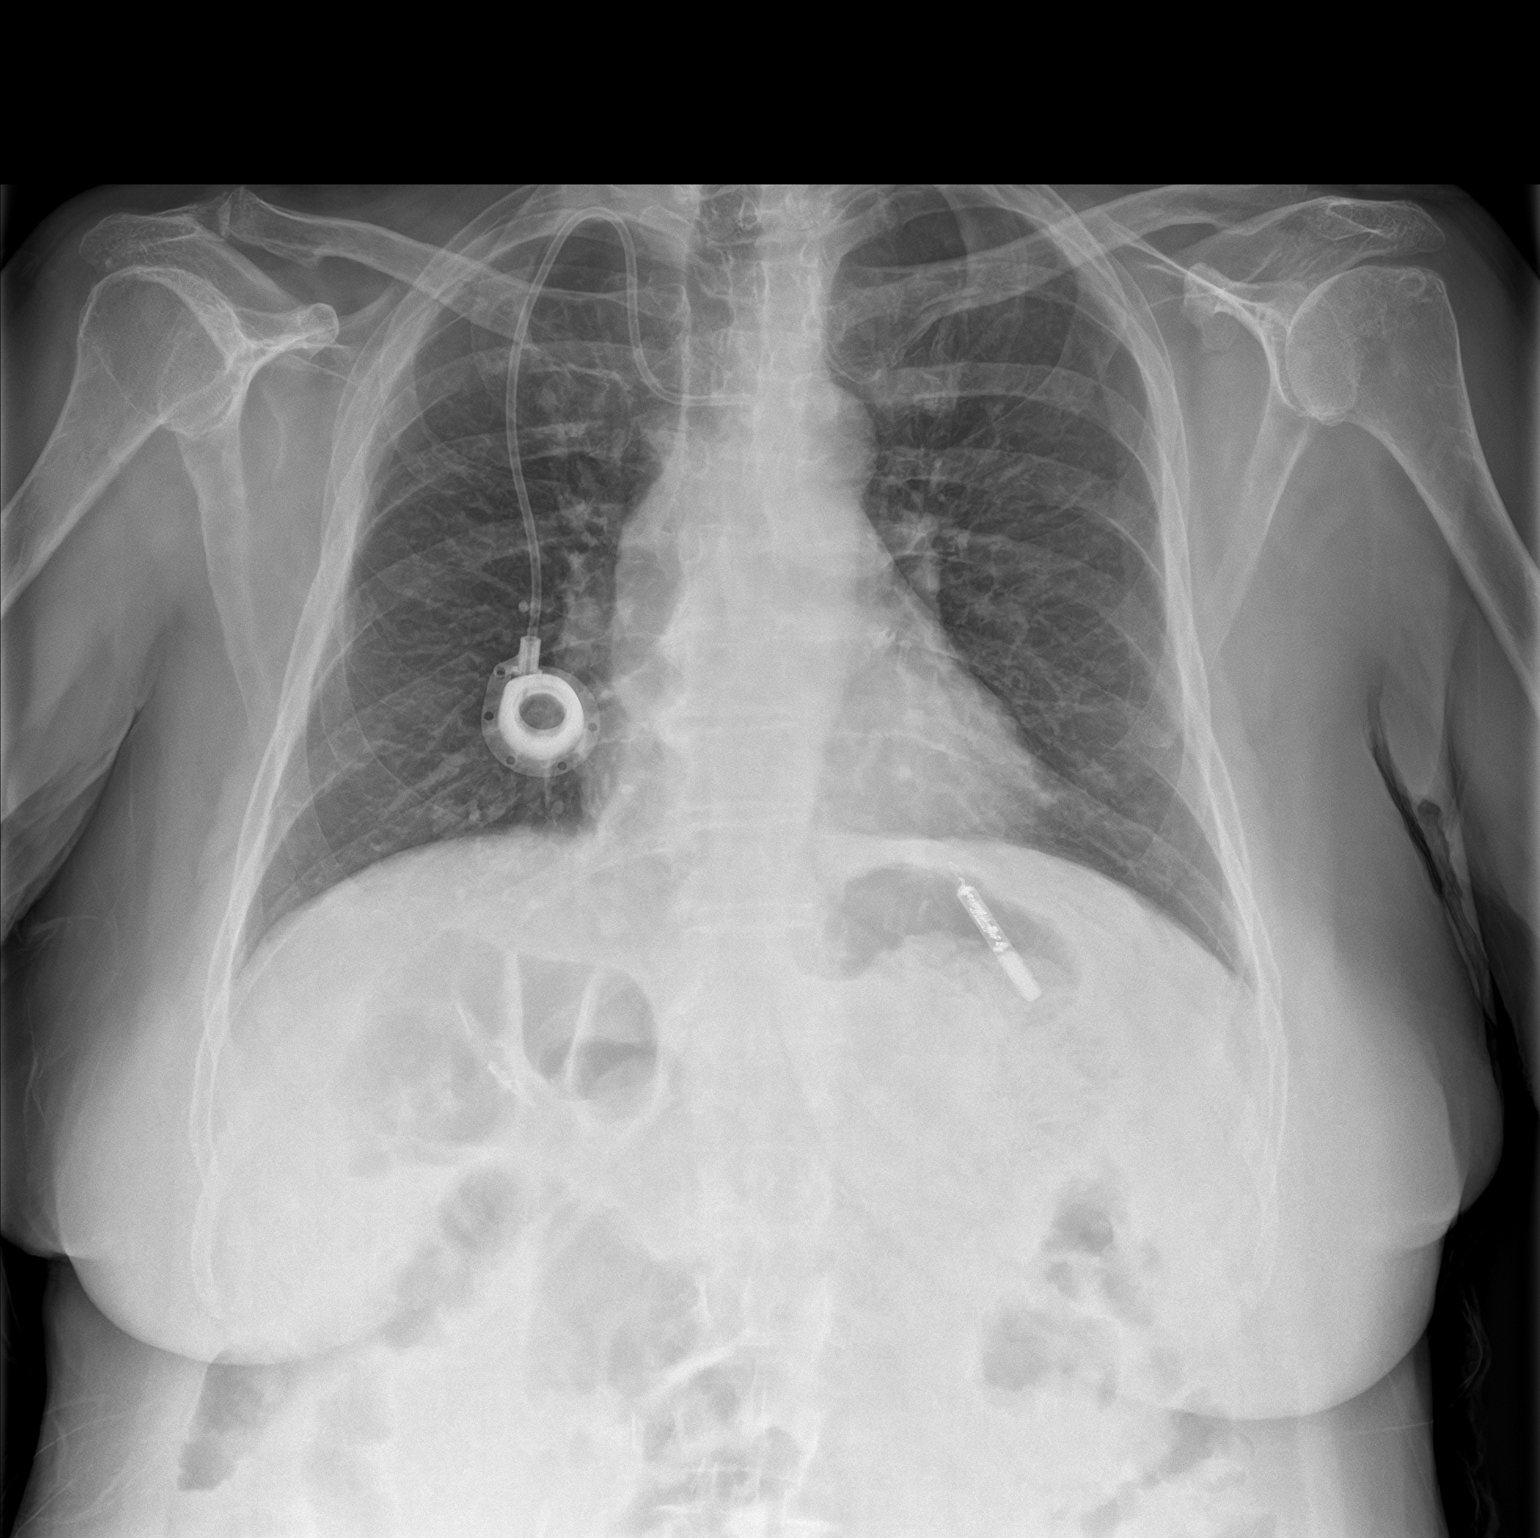
[im 2/2]
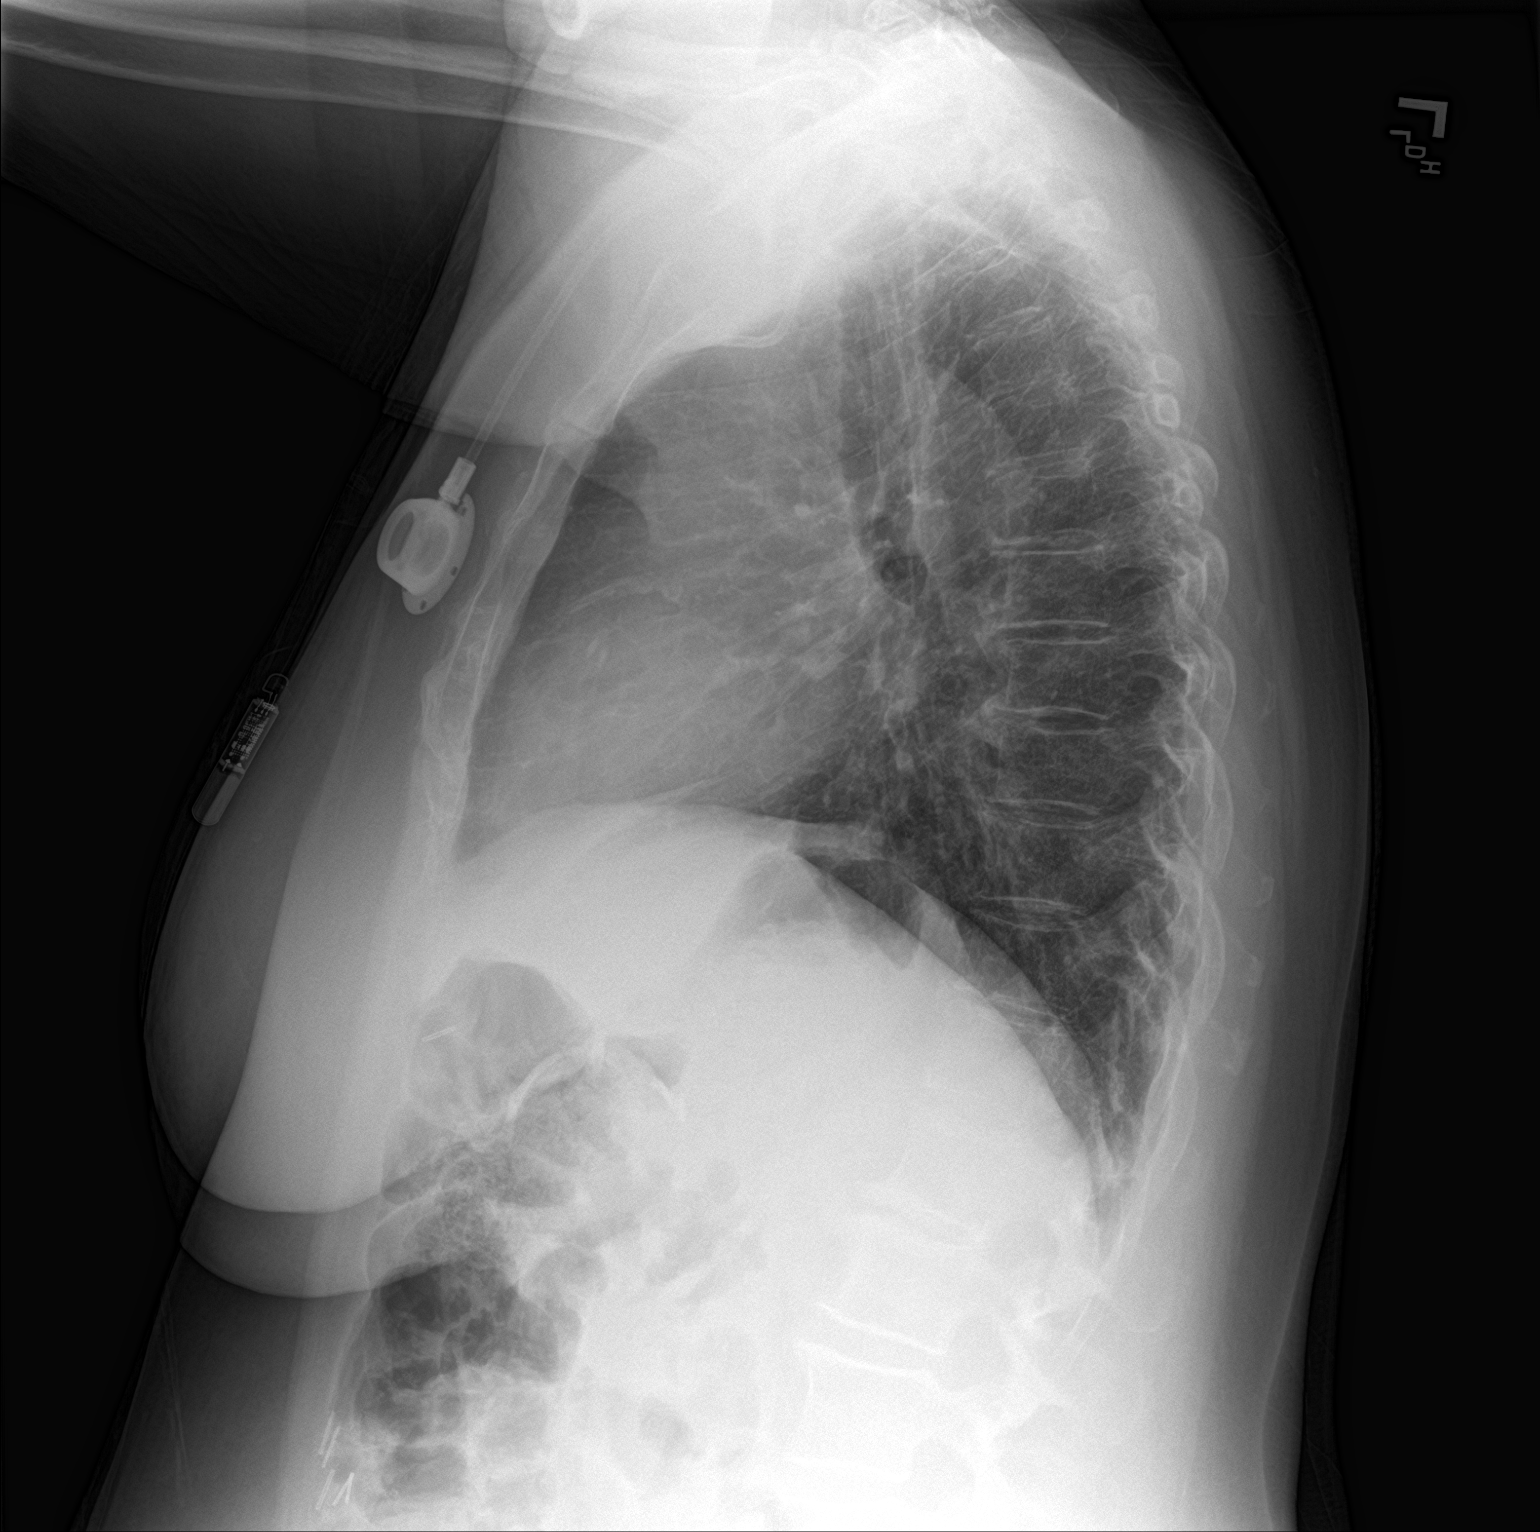

[2 of 2 positions shown; findings below may reference images not displayed]

FINDINGS: Right-sided central venous port tip with catheter directed to the
patient's left over the expected location of no focal opacity or
pleural effusion. Normal cardiomediastinal silhouette. No
pneumothorax. Electronic device over the left chest. Brachiocephalic
region.
IMPRESSION: 1. No acute airspace disease
2. Right-sided central venous port tip directed to the patient's
left over the mediastinum, probably within the left brachiocephalic
vein
